# Patient Record
Sex: Female | Born: 2000
Health system: Southern US, Community
[De-identification: ages and names within clinical notes are randomized; demographics above are authoritative.]

## PROBLEM LIST (undated history)

## (undated) DIAGNOSIS — S36039A Unspecified laceration of spleen, initial encounter: Secondary | ICD-10-CM

## (undated) DIAGNOSIS — E039 Hypothyroidism, unspecified: Secondary | ICD-10-CM

## (undated) DIAGNOSIS — S0291XA Unspecified fracture of skull, initial encounter for closed fracture: Secondary | ICD-10-CM

## (undated) HISTORY — DX: Unspecified fracture of skull, initial encounter for closed fracture: S02.91XA

## (undated) HISTORY — PX: WISDOM TOOTH EXTRACTION: SHX21

## (undated) HISTORY — DX: Unspecified laceration of spleen, initial encounter: S36.039A

---
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
## 2019-01-28 DIAGNOSIS — E063 Autoimmune thyroiditis: Secondary | ICD-10-CM | POA: Diagnosis not present

## 2019-01-29 LAB — T4, FREE: Free T4: 1.3 ng/dL (ref 0.8–1.4)

## 2019-01-29 LAB — T3, FREE: T3, Free: 3 pg/mL (ref 3.0–4.7)

## 2019-01-29 LAB — TSH: TSH: 0.81 mIU/L

## 2019-02-01 ENCOUNTER — Encounter: Payer: Self-pay | Admitting: *Deleted

## 2019-02-22 DIAGNOSIS — Z23 Encounter for immunization: Secondary | ICD-10-CM | POA: Diagnosis not present

## 2019-04-17 ENCOUNTER — Other Ambulatory Visit (INDEPENDENT_AMBULATORY_CARE_PROVIDER_SITE_OTHER): Payer: Self-pay | Admitting: *Deleted

## 2019-04-17 ENCOUNTER — Encounter (INDEPENDENT_AMBULATORY_CARE_PROVIDER_SITE_OTHER): Payer: Self-pay | Admitting: *Deleted

## 2019-04-17 ENCOUNTER — Telehealth (INDEPENDENT_AMBULATORY_CARE_PROVIDER_SITE_OTHER): Payer: Self-pay | Admitting: "Endocrinology

## 2019-04-17 DIAGNOSIS — E063 Autoimmune thyroiditis: Secondary | ICD-10-CM

## 2019-04-17 NOTE — Telephone Encounter (Signed)
°  Who's calling (name and relationship to patient) : Cisse,[name] Best contact number: [phone] Provider they see: Tobe Sos Reason for call: [name] will be leaving for school soon.  Please put orders in for labs that she will need done for her web appt on 8/13.  Please call when complete.    PRESCRIPTION REFILL ONLY  Name of prescription:  Pharmacy:

## 2019-04-17 NOTE — Telephone Encounter (Signed)
Mychart message sent, labs in portal to be done 1 week prior to visit.

## 2019-04-29 ENCOUNTER — Other Ambulatory Visit (INDEPENDENT_AMBULATORY_CARE_PROVIDER_SITE_OTHER): Payer: Self-pay | Admitting: *Deleted

## 2019-04-29 ENCOUNTER — Telehealth (INDEPENDENT_AMBULATORY_CARE_PROVIDER_SITE_OTHER): Payer: Self-pay | Admitting: Radiology

## 2019-04-29 DIAGNOSIS — R5383 Other fatigue: Secondary | ICD-10-CM

## 2019-04-29 NOTE — Telephone Encounter (Signed)
  Who's calling (name and relationship to patient) : Ruba Outen    Best contact number: [phone]  Provider they see: Dr Tobe Sos  Reason for call: Georgann called to see if we could have an iron test added to her order for her labs. Nurse added the test for her and she will be coming in this week for the draw.     PRESCRIPTION REFILL ONLY  Name of prescription:  Pharmacy:

## 2019-04-30 DIAGNOSIS — E063 Autoimmune thyroiditis: Secondary | ICD-10-CM | POA: Diagnosis not present

## 2019-04-30 DIAGNOSIS — R5383 Other fatigue: Secondary | ICD-10-CM | POA: Diagnosis not present

## 2019-05-01 LAB — T4, FREE: Free T4: 1.2 ng/dL (ref 0.8–1.4)

## 2019-05-01 LAB — IRON: Iron: 74 ug/dL (ref 27–164)

## 2019-05-01 LAB — TSH: TSH: 0.28 mIU/L — ABNORMAL LOW

## 2019-05-01 LAB — T3, FREE: T3, Free: 2.9 pg/mL — ABNORMAL LOW (ref 3.0–4.7)

## 2019-05-08 ENCOUNTER — Other Ambulatory Visit (INDEPENDENT_AMBULATORY_CARE_PROVIDER_SITE_OTHER): Payer: Self-pay | Admitting: "Endocrinology

## 2019-05-08 DIAGNOSIS — E063 Autoimmune thyroiditis: Secondary | ICD-10-CM

## 2019-05-08 DIAGNOSIS — L710 Perioral dermatitis: Secondary | ICD-10-CM | POA: Diagnosis not present

## 2019-05-08 DIAGNOSIS — L219 Seborrheic dermatitis, unspecified: Secondary | ICD-10-CM | POA: Diagnosis not present

## 2019-05-23 ENCOUNTER — Other Ambulatory Visit: Payer: Self-pay

## 2019-05-23 ENCOUNTER — Ambulatory Visit (INDEPENDENT_AMBULATORY_CARE_PROVIDER_SITE_OTHER): Payer: BC Managed Care – PPO | Admitting: "Endocrinology

## 2019-05-23 DIAGNOSIS — G44229 Chronic tension-type headache, not intractable: Secondary | ICD-10-CM | POA: Diagnosis not present

## 2019-05-23 DIAGNOSIS — E063 Autoimmune thyroiditis: Secondary | ICD-10-CM | POA: Diagnosis not present

## 2019-05-23 DIAGNOSIS — F5101 Primary insomnia: Secondary | ICD-10-CM

## 2019-05-23 DIAGNOSIS — R5383 Other fatigue: Secondary | ICD-10-CM | POA: Diagnosis not present

## 2019-05-23 DIAGNOSIS — E049 Nontoxic goiter, unspecified: Secondary | ICD-10-CM

## 2019-05-23 NOTE — Patient Instructions (Addendum)
No follow up. She will transfer her care to [facility] endocrinology.

## 2019-05-23 NOTE — Progress Notes (Signed)
Subjective:  Subjective  Patient Name: Pamela Cobb Date of Birth: June 25, 2001  MRN: [id number]  Pamela Cobb presents for her WebEx visit today for follow-up evaluation and management of her acquired hypothyroidism, goiter, thyroiditis, and fatigue.   HISTORY OF PRESENT ILLNESS:   Pamela Cobb is a 18 y.o. Caucasian young lady.  Lien was unaccompanied.  1. Pamela Cobb was first seen at our clinic on 06/27/12 in referral from her pediatrician, Dr. Kandace Blitz, for evaluation of growth arrest. On 07/04/12 her TFTs showed that she was profoundly hypothyroid. Her TSH was 335.14, free T4 0.32, free T3 0.8. Her TPO antibody was markedly positive at 5586.0, c/w Hashimoto's disease. She also had elevated AST and ALT enzymes, 63 and 74 respectively, c/w hypoactive liver caused by severe hypothyroidism. Her IGF-1 was 86 and her IGFBP-3 was 2952. She was started on Synthroid with normalization of her liver function tests and growth. .    2. Since that initial visit we have gradually adjusted Pamela Cobb's Synthroid doses many times based upon her TFTs results, with the goal of keeping her TSH in the 1.0-2.0 range. She grew well in height and had the typical pubertal growth spurt. Her height growth began to plateau about 15 months after she underwent menarche in March 2016.   3. [name] was on 11/13/18. At that visit I changed her Synthroid dose to 112 mcg/day for 6 days each week, but no Synthroid on one day each week. However, she misunderstood, so has bee taking the synthroid 112 mcg/day every day.   A. In the interim, she has been healthy. She is not tired. Her energy level is normal.    B. She does not feel hyper. She is not having any insomnia or early awakening.    C. She has not been having many headaches. The headaches are mostly frontal, but sometimes in a band-like distribution from her frontal area to the back of her head and down into her posterior neck and both trapezius  areas.  She no longer has aching in her back.   D. Her intermittent hives have not recurred.   E. She is walking and running now.   F. Since she is now attending [employer], Cassundra and her mother want to transfer her endocrine care to [name]. Her mother has the name of a doctor that she wants Wava to see. I agreed to refer her to [facility].   G. At the end of the visit today she brought up the issue of often having problems with attention and focusing. She asked for a recommendation. I suggested that since she will be transferring her endocrine care to [facility], she should also seek testing for ADD at [facility] as well.    4. Pertinent Review of Systems:  Constitutional: The patient feels "pretty good". Her body temperature is normal when compared with her peers.  She drinks a cup of coffee every few days. She does not drink sodas.  Eyes: Vision seems to be good. There are no recognized eye problems. Neck: The patient has no complaints of anterior neck swelling, soreness, tenderness, pressure, discomfort, or difficulty swallowing.   Heart: Heart rate increases with exercise or other physical activity. The patient has no complaints of palpitations, irregular heart beats, chest pain, or chest pressure.   Gastrointestinal: Bowel movents seem normal. The patient has no complaints of excessive hunger, acid reflux, upset stomach, stomach aches or pains, diarrhea, or constipation.  Legs: She no longer has hip pains. Muscle mass and  strength seem normal. There are no complaints of numbness, tingling, burning, or pain. No edema is noted.  Feet: There are no obvious foot problems. There are no complaints of numbness, tingling, burning, or pain. No edema is noted. Neurologic: There are no recognized problems with muscle movement and strength, sensation, or coordination. GYN: She underwent menarche March 1st 2016. LMP occurred about three weeks ago. She is now taking OCPs to regulate her periods.      PAST MEDICAL, FAMILY,  AND SOCIAL HISTORY  Past Medical History:  Diagnosis Date  . Hypothyroid   . Skull fracture ([place])   . Spleen laceration     Family History  Problem Relation Age of Onset  . Cancer Maternal Grandfather        brain cancer  . Thyroid disease Maternal Aunt        Has hashimoto's disease and hypothyroidism.  . Diabetes Maternal Aunt   . Kidney disease Maternal Aunt        HAd T1DM  . Heart disease Neg Hx   . Stroke Neg Hx      Current Outpatient Medications:  .  JUNEL FE 1/20 1-20 MG-MCG tablet, TK 1 T PO D, Disp: , Rfl:  .  PROAIR HFA 108 (90 BASE) MCG/ACT inhaler, Inhale 2 puffs into the lungs as needed. Reported on 03/23/2016, Disp: , Rfl:  .  SYNTHROID 112 MCG tablet, TAKE 1 TABLET BY MOUTH 6 DAYS A WEEK, Disp: 90 tablet, Rfl: 1 .  ipratropium (ATROVENT) 0.06 % nasal spray, INSRILL 2 SPRAYS NASALLY QID, Disp: , Rfl:   Allergies as of 05/23/2019 - Review Complete 05/23/2019  Allergen Reaction Noted  . Penicillins  06/27/2012     reports that she has never smoked. She has never used smokeless tobacco. She reports that she does not drink alcohol or use drugs. Pediatric History  Patient Parents  . Cloer,[name] (Mother)  . Fosse,[name] (Father)   Other Topics Concern  . Not on file  Social History Narrative   Is in 12th grade at [employer] with parents and 2 brothers   Swim and ballet   School: She is attending [employer] with on-line classes now.  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 She walks a lot and runs. Primary Care Provider: Kandace Blitz, MD  REVIEW OF SYSTEMS: There are no other significant problems involving Pamela Cobb's other body systems.    Objective:  Objective  Vital Signs:  There were no vitals taken for this visit.  Blood pressure percentiles are not available for patients who are 18 years or older.  Ht Readings from Last 3 Encounters:  11/13/18 5' 5.91" (1.674 m) (74 %, Z= 0.66)*  03/13/18 5' 5.83" (1.672  m) (74 %, Z= 0.65)*  03/29/17 5' 4.96" (1.65 m) (64 %, Z= 0.35)*   * Growth percentiles are based on CDC (Girls, 2-20 Years) data.   Wt Readings from Last 3 Encounters:  11/13/18 131 lb 6.4 oz (59.6 kg) (63 %, Z= 0.34)*  03/13/18 139 lb (63 kg) (76 %, Z= 0.70)*  09/29/17 133 lb 9.6 oz (60.6 kg) (70 %, Z= 0.54)*   * Growth percentiles are based on CDC (Girls, 2-20 Years) data.   HC Readings from Last 3 Encounters:  No data found for [facility]   There is no height or weight on file to calculate BSA. No height on file for this encounter. No weight on file for this encounter.   PHYSICAL EXAM:  Constitutional: Madelina appears quite normal today. She looks slimmer.  Head: The head is normocephalic. Face: The face is normal.  Eyes: The eyes appear to be normally formed and spaced. Gaze is conjugate. There is no obvious arcus or proptosis. Moisture appears normal. Ears: The ears are normally placed and appear externally normal. Mouth: Normal oropharynx and tongue. Normal moisture.   Neck: The neck appears to be visibly normal.  LAB DATA:   Results for orders placed or performed in visit on 04/17/19 (from the past 672 hour(s))  T3, free   Collection Time: 04/30/19 12:59 PM  Result Value Ref Range   T3, Free 2.9 (L) 3.0 - 4.7 pg/mL  T4, free   Collection Time: 04/30/19 12:59 PM  Result Value Ref Range   Free T4 1.2 0.8 - 1.4 ng/dL  TSH   Collection Time: 04/30/19 12:59 PM  Result Value Ref Range   TSH 0.28 (L) mIU/L  Iron   Collection Time: 04/30/19 12:59 PM  Result Value Ref Range   Iron 74 27 - 164 mcg/dL    Labs 04/30/19: TSH 0.28, free T4 1.2, free T3 2.9; iron 74  Labs 11/13/18: TSH 5.20, free T4 1.3, free T3 3.0  Labs 09/21/18: TSH 3.37, free T4 1.0, free T3 2.6  Labs 04/14/18: TSH 0.81, free T4 1.0, free T3 2.6  Labs 03/13/18: CMP normal, except for glucose of 51, which was probably an artifact of delayed processing; CBC normal; ESR normal; CRP normal; CK normal  Labs  03/07/18: TSH 0.09, free T4 1.3, free T3 3.4  Labs 01/03/18: TSH 0.17, free T4 1.3, free T3 3.1  Labs 09/26/17: TSH 0.12, free T4 1.3, free T3 3.5  Labs 06/30/17: TSH 1.15, free T4 1.2, free T3 3.1  Labs 03/16/17: TSH 0.09, free T4 1.2, free T3 3.1  Labs 09/21/16: TSH 2.67, free T4 1.1, free T3 3.1  Labs 06/23/16: TSH 0.22, free T4 1.5, free T3 4.1; WBC 6.2, Hgb 11.6, Hct 35.9%; iron 117; CMP normal  Labs 03/21/16: TSH 6.18, free T4 1.10, free T3 3.0; WBC 6.2, Hgb 12.9, Hct 39.1, iron 51; CMP normal  Labs 09/07/15: TSH 1.957, free T4 1.05, free T3 2.9  Labs 03/11/15: TSH 7.598, free T4 0.64, free T3 3.0; CBC normal, CMP normal for age and pubertal status  Labs 1221/15: TSH .754, free T4 1.01, free T3 3.7  Labs 07/03/14: TSH 2.24, free T4 1.1, free T3 3.9    Assessment and Plan:  Assessment  ASSESSMENT:  1-3. Acquired hypothyroidism secondary to Hashimoto's thyroiditis/goiter/hyperthyroidism:   A. Alayzia's TFTs have been hyperthyroid, euthyroid, and borderline hypothyroid during the past year. She continues to have flare ups of thyroiditis.     B. Her goiter was larger at her last visit, but appears to be smaller today. The process of waxing and waning of thyroid gland size is c/w evolving Hashimoto's thyroiditis and/or Graves' disease.   C. Her thyroiditis is clinically quiescent today. However, from September to December 2015 all three of her TFTs had decreased in parallel together. Conversely from November 2016 to June 2017 all three of her TFTs increased in parallel together. In 2020, all three of her TFTs have decreased in parallel together from February to The shift of all three TFTs in parallel together, upward or downward, is pathognomonic for a recent flare up of Hashimoto's thyroiditis.   D. Given Skilynn's continuing thyroid inflammation, it is likely that Stevee will become more hypothyroid over time.     E. Given  her attention issues, we will reduce her Synthroid dose slightly  now.  4. Fatigue: This problem has essentially resolved.   5-6. Headaches and back pains: These problems have improved greatly.  7. Insomnia: She no longer has insomnia.  8. Tremor: She still had a mild tremor at her last visit.   PLAN:  1. Diagnostic: Repeat TFTs in 3 months if she decides to return to our clinic. Otherwise repeat lab tests as ordered at [facility].  2. Therapeutic: Continue current Synthroid dose of 112 mcg daily for 6 days each week, but take only 1/2 tablet on the 7th day of each week.   3. Patient education: Reviewed lab results and growth data. Reviewed typical progression of Hashimoto's disease and hypothyroidism. I answered all of Kaitlyne's questions.  4. Follow-up: I offered follow up at 4 months, but Hiya and mom declined the offer.    Level of Service: This visit lasted in excess of 35 minutes. More than 50% of the visit was devoted to counseling.  Tillman Sers, MD, CDE Pediatric and Adult Endocrinology   This is a Pediatric Specialist E-Visit follow up consult provided via WebEx. Kensie Mcneice consented to an E-Visit consult today.  Location of patient: Cannie is at her home. Location of provider: Tillman Sers, MD is at his office. Patient was referred by Kandace Blitz, MD   The following participants were involved in this E-Visit: Curt Bears and Dr. Tobe Sos  Chief Complain/ Reason for E-Visit today: Acquired hypothyroidism, thyroiditis, goiter, fatigue Total time on call: 31 minutes Follow up: At [facility]

## 2019-05-31 DIAGNOSIS — Z20828 Contact with and (suspected) exposure to other viral communicable diseases: Secondary | ICD-10-CM | POA: Diagnosis not present

## 2019-07-10 ENCOUNTER — Telehealth (INDEPENDENT_AMBULATORY_CARE_PROVIDER_SITE_OTHER): Payer: Self-pay | Admitting: "Endocrinology

## 2019-07-10 NOTE — Telephone Encounter (Signed)
°  Who's calling (name and relationship to patient) : Nicki Reaper (dad)  Best contact number: [phone]  Provider they see: Tobe Sos  Reason for call: Dad called and would like a referral sent to Dr Claiborne Billings at [facility] for patient to transfer her care there.  Please call.     PRESCRIPTION REFILL ONLY  Name of prescription:  Pharmacy:

## 2019-07-10 NOTE — Telephone Encounter (Signed)
°  Who's calling (name and relationship to patient) : [name]  Best contact number: [phone] Provider they see: Tobe Sos  Reason for call: Mom LVM with provider information for the referral  [place], [place] [address] [address]  Name of prescription:  Pharmacy:

## 2019-07-10 NOTE — Telephone Encounter (Signed)
Referral has been put in to [place] with Dr Georgina Peer, Netta Cedars, MD.

## 2019-08-20 DIAGNOSIS — J019 Acute sinusitis, unspecified: Secondary | ICD-10-CM | POA: Diagnosis not present

## 2019-08-20 DIAGNOSIS — B9689 Other specified bacterial agents as the cause of diseases classified elsewhere: Secondary | ICD-10-CM | POA: Diagnosis not present

## 2019-09-10 DIAGNOSIS — R41840 Attention and concentration deficit: Secondary | ICD-10-CM | POA: Diagnosis not present

## 2019-09-12 DIAGNOSIS — F900 Attention-deficit hyperactivity disorder, predominantly inattentive type: Secondary | ICD-10-CM | POA: Diagnosis not present

## 2019-11-02 ENCOUNTER — Other Ambulatory Visit (INDEPENDENT_AMBULATORY_CARE_PROVIDER_SITE_OTHER): Payer: Self-pay | Admitting: "Endocrinology

## 2019-11-02 DIAGNOSIS — E063 Autoimmune thyroiditis: Secondary | ICD-10-CM

## 2020-07-08 ENCOUNTER — Ambulatory Visit: Payer: BLUE CROSS/BLUE SHIELD | Admitting: Neurology

## 2020-12-11 DIAGNOSIS — F902 Attention-deficit hyperactivity disorder, combined type: Secondary | ICD-10-CM | POA: Diagnosis not present

## 2020-12-11 DIAGNOSIS — E039 Hypothyroidism, unspecified: Secondary | ICD-10-CM | POA: Diagnosis not present

## 2020-12-11 DIAGNOSIS — L700 Acne vulgaris: Secondary | ICD-10-CM | POA: Diagnosis not present

## 2020-12-15 DIAGNOSIS — B360 Pityriasis versicolor: Secondary | ICD-10-CM | POA: Diagnosis not present

## 2020-12-15 DIAGNOSIS — L700 Acne vulgaris: Secondary | ICD-10-CM | POA: Diagnosis not present

## 2021-02-15 DIAGNOSIS — L700 Acne vulgaris: Secondary | ICD-10-CM | POA: Diagnosis not present

## 2021-02-18 DIAGNOSIS — J0110 Acute frontal sinusitis, unspecified: Secondary | ICD-10-CM | POA: Diagnosis not present

## 2021-02-18 DIAGNOSIS — E039 Hypothyroidism, unspecified: Secondary | ICD-10-CM | POA: Diagnosis not present

## 2021-02-18 DIAGNOSIS — F902 Attention-deficit hyperactivity disorder, combined type: Secondary | ICD-10-CM | POA: Diagnosis not present

## 2021-02-21 DIAGNOSIS — Z20828 Contact with and (suspected) exposure to other viral communicable diseases: Secondary | ICD-10-CM | POA: Diagnosis not present

## 2021-02-28 DIAGNOSIS — J0140 Acute pansinusitis, unspecified: Secondary | ICD-10-CM | POA: Diagnosis not present

## 2021-10-06 DIAGNOSIS — F902 Attention-deficit hyperactivity disorder, combined type: Secondary | ICD-10-CM | POA: Diagnosis not present

## 2021-10-06 DIAGNOSIS — J0110 Acute frontal sinusitis, unspecified: Secondary | ICD-10-CM | POA: Diagnosis not present

## 2021-10-06 DIAGNOSIS — E039 Hypothyroidism, unspecified: Secondary | ICD-10-CM | POA: Diagnosis not present

## 2022-02-23 ENCOUNTER — Other Ambulatory Visit (HOSPITAL_COMMUNITY)
Admission: RE | Admit: 2022-02-23 | Discharge: 2022-02-23 | Disposition: A | Discharge status: Home / Self Care | Payer: BLUE CROSS/BLUE SHIELD | Source: Ambulatory Visit | Attending: Family Medicine | Admitting: Family Medicine

## 2022-02-23 ENCOUNTER — Other Ambulatory Visit: Payer: Self-pay | Admitting: Family Medicine

## 2022-02-23 DIAGNOSIS — Z79899 Other long term (current) drug therapy: Secondary | ICD-10-CM | POA: Diagnosis not present

## 2022-02-23 DIAGNOSIS — E039 Hypothyroidism, unspecified: Secondary | ICD-10-CM | POA: Diagnosis not present

## 2022-02-23 DIAGNOSIS — Z01411 Encounter for gynecological examination (general) (routine) with abnormal findings: Secondary | ICD-10-CM | POA: Insufficient documentation

## 2022-02-23 DIAGNOSIS — Z0000 Encounter for general adult medical examination without abnormal findings: Secondary | ICD-10-CM | POA: Diagnosis not present

## 2022-02-23 DIAGNOSIS — Z124 Encounter for screening for malignant neoplasm of cervix: Secondary | ICD-10-CM | POA: Diagnosis not present

## 2022-02-23 DIAGNOSIS — F902 Attention-deficit hyperactivity disorder, combined type: Secondary | ICD-10-CM | POA: Diagnosis not present

## 2022-02-28 LAB — CYTOLOGY - PAP
Comment: NEGATIVE
Diagnosis: UNDETERMINED — AB
High risk HPV: NEGATIVE

## 2022-05-12 ENCOUNTER — Encounter (INDEPENDENT_AMBULATORY_CARE_PROVIDER_SITE_OTHER): Payer: Self-pay

## 2023-02-09 ENCOUNTER — Other Ambulatory Visit (HOSPITAL_COMMUNITY)
Admission: RE | Admit: 2023-02-09 | Discharge: 2023-02-09 | Disposition: A | Discharge status: Home / Self Care | Payer: BC Managed Care – PPO | Source: Ambulatory Visit | Attending: Family Medicine | Admitting: Family Medicine

## 2023-02-09 ENCOUNTER — Other Ambulatory Visit: Payer: Self-pay | Admitting: Family Medicine

## 2023-02-09 DIAGNOSIS — Z0000 Encounter for general adult medical examination without abnormal findings: Secondary | ICD-10-CM | POA: Diagnosis not present

## 2023-02-09 DIAGNOSIS — Z01411 Encounter for gynecological examination (general) (routine) with abnormal findings: Secondary | ICD-10-CM | POA: Insufficient documentation

## 2023-02-09 DIAGNOSIS — E039 Hypothyroidism, unspecified: Secondary | ICD-10-CM | POA: Diagnosis not present

## 2023-02-09 DIAGNOSIS — Z3009 Encounter for other general counseling and advice on contraception: Secondary | ICD-10-CM | POA: Diagnosis not present

## 2023-02-09 DIAGNOSIS — F902 Attention-deficit hyperactivity disorder, combined type: Secondary | ICD-10-CM | POA: Diagnosis not present

## 2023-02-09 DIAGNOSIS — Z79899 Other long term (current) drug therapy: Secondary | ICD-10-CM | POA: Diagnosis not present

## 2023-02-24 LAB — CYTOLOGY - PAP
Adequacy: ABSENT
Comment: NEGATIVE
Diagnosis: NEGATIVE
High risk HPV: NEGATIVE

## 2023-09-27 DIAGNOSIS — L710 Perioral dermatitis: Secondary | ICD-10-CM | POA: Diagnosis not present

## 2023-11-29 DIAGNOSIS — L700 Acne vulgaris: Secondary | ICD-10-CM | POA: Diagnosis not present

## 2024-01-12 DIAGNOSIS — F902 Attention-deficit hyperactivity disorder, combined type: Secondary | ICD-10-CM | POA: Diagnosis not present

## 2024-01-12 DIAGNOSIS — F419 Anxiety disorder, unspecified: Secondary | ICD-10-CM | POA: Diagnosis not present

## 2024-01-12 DIAGNOSIS — E039 Hypothyroidism, unspecified: Secondary | ICD-10-CM | POA: Diagnosis not present

## 2024-01-19 DIAGNOSIS — L700 Acne vulgaris: Secondary | ICD-10-CM | POA: Diagnosis not present

## 2024-04-21 DIAGNOSIS — J45909 Unspecified asthma, uncomplicated: Secondary | ICD-10-CM | POA: Diagnosis not present

## 2024-05-13 DIAGNOSIS — H01132 Eczematous dermatitis of right lower eyelid: Secondary | ICD-10-CM | POA: Diagnosis not present

## 2024-05-31 DIAGNOSIS — Z79899 Other long term (current) drug therapy: Secondary | ICD-10-CM | POA: Diagnosis not present

## 2024-05-31 DIAGNOSIS — E039 Hypothyroidism, unspecified: Secondary | ICD-10-CM | POA: Diagnosis not present

## 2024-05-31 DIAGNOSIS — F419 Anxiety disorder, unspecified: Secondary | ICD-10-CM | POA: Diagnosis not present

## 2024-05-31 DIAGNOSIS — Z0000 Encounter for general adult medical examination without abnormal findings: Secondary | ICD-10-CM | POA: Diagnosis not present

## 2024-05-31 DIAGNOSIS — F902 Attention-deficit hyperactivity disorder, combined type: Secondary | ICD-10-CM | POA: Diagnosis not present

## 2024-06-13 DIAGNOSIS — R509 Fever, unspecified: Secondary | ICD-10-CM | POA: Diagnosis not present

## 2024-06-13 DIAGNOSIS — K120 Recurrent oral aphthae: Secondary | ICD-10-CM | POA: Diagnosis not present

## 2024-06-13 DIAGNOSIS — R21 Rash and other nonspecific skin eruption: Secondary | ICD-10-CM | POA: Diagnosis not present
